# Patient Record
Sex: Male | Born: 2000 | Race: Black or African American | Hispanic: No | Marital: Single | Smoking: Never smoker
Health system: Southern US, Community
[De-identification: ages and names within clinical notes are randomized; demographics above are authoritative.]

## PROBLEM LIST (undated history)

## (undated) DIAGNOSIS — J45909 Unspecified asthma, uncomplicated: Secondary | ICD-10-CM

---
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
## 2020-01-16 ENCOUNTER — Emergency Department (HOSPITAL_BASED_OUTPATIENT_CLINIC_OR_DEPARTMENT_OTHER)
Admission: EM | Admit: 2020-01-16 | Discharge: 2020-01-16 | Disposition: A | Discharge status: Home / Self Care | Payer: BC Managed Care – PPO | Attending: Emergency Medicine | Admitting: Emergency Medicine

## 2020-01-16 ENCOUNTER — Other Ambulatory Visit: Payer: Self-pay

## 2020-01-16 ENCOUNTER — Encounter (HOSPITAL_BASED_OUTPATIENT_CLINIC_OR_DEPARTMENT_OTHER): Payer: Self-pay | Admitting: *Deleted

## 2020-01-16 DIAGNOSIS — J4520 Mild intermittent asthma, uncomplicated: Secondary | ICD-10-CM | POA: Diagnosis present

## 2020-01-16 DIAGNOSIS — Z760 Encounter for issue of repeat prescription: Secondary | ICD-10-CM | POA: Diagnosis not present

## 2020-01-16 HISTORY — DX: Unspecified asthma, uncomplicated: J45.909

## 2020-01-16 MED ORDER — ALBUTEROL SULFATE HFA 108 (90 BASE) MCG/ACT IN AERS: 1.0000 | INHALATION_SPRAY | Freq: Four times a day (QID) | RESPIRATORY_TRACT | 1 refills | Status: AC | PRN

## 2020-01-16 MED ORDER — ALBUTEROL SULFATE (2.5 MG/3ML) 0.083% IN NEBU: 2.5000 mg | INHALATION_SOLUTION | Freq: Four times a day (QID) | RESPIRATORY_TRACT | 12 refills | Status: AC | PRN

## 2020-01-16 MED ORDER — ALBUTEROL SULFATE HFA 108 (90 BASE) MCG/ACT IN AERS: 1.0000 | INHALATION_SPRAY | Freq: Four times a day (QID) | RESPIRATORY_TRACT | 1 refills | Status: DC | PRN

## 2020-01-16 MED ORDER — ALBUTEROL SULFATE HFA 108 (90 BASE) MCG/ACT IN AERS
2.0000 | INHALATION_SPRAY | RESPIRATORY_TRACT | Status: DC | PRN
  Administered 2020-01-16: 2 via RESPIRATORY_TRACT
  Filled 2020-01-16: qty 6.7

## 2020-01-16 NOTE — ED Provider Notes (Signed)
[facility] EMERGENCY DEPARTMENT Provider Note   CSN: [id number] Arrival date & time: 01/16/20  1800     History Chief Complaint  Patient presents with  . Asthma    Greg Villegas is a 19 y.o. male past medical history of asthma, presenting to the emergency department with request for medication refill of his albuterol.  He states he currently does not have a PCP because he recently aged out of his pediatrician.  He manages his asthma with albuterol as needed, however he moved into a new apartment and his wheezing and chest tightness seem to worsen whenever he gets home.  He attributes this to the carpet in his home.  He states he has been taking a daily allergy medication though is unsure of the name.  When he uses his albuterol his symptoms improved.  He is not currently having any symptoms in the ED.  The history is provided by the patient.       Past Medical History:  Diagnosis Date  . Asthma     There are no problems to display for this patient.   History reviewed. No pertinent surgical history.     History reviewed. No pertinent family history.  Social History   Tobacco Use  . Smoking status: Never Smoker  . Smokeless tobacco: Never Used  Substance Use Topics  . Alcohol use: Never  . Drug use: Never    Home Medications Prior to Admission medications   Medication Sig Start Date End Date Taking? Authorizing Provider  albuterol (VENTOLIN HFA) 108 (90 Base) MCG/ACT inhaler Inhale 1-2 puffs into the lungs every 6 (six) hours as needed for wheezing or shortness of breath. 01/16/20   Tanasia Budzinski, [place] N, PA-C  cyclobenzaprine (FLEXERIL) 10 MG tablet Take 1 tablet (10 mg total) by mouth 2 (two) times daily as needed for muscle spasms. 11/01/18   Demani Mcbrien, [place], PA-C    Allergies    Patient has no known allergies.  Review of Systems   Review of Systems  All other systems reviewed and are negative.   Physical Exam Updated Vital Signs BP 139/78    Pulse 72   Temp 99 F (37.2 C) (Oral)   Resp 18   Ht 5\' 8"  (1.727 m)   Wt 68 kg   SpO2 100%   BMI 22.81 kg/m   Physical Exam Vitals and nursing note reviewed.  Constitutional:      Appearance: He is well-developed.  HENT:     Head: Normocephalic and atraumatic.  Eyes:     Conjunctiva/sclera: Conjunctivae normal.  Cardiovascular:     Rate and Rhythm: Normal rate and regular rhythm.  Pulmonary:     Effort: Pulmonary effort is normal. No respiratory distress.     Breath sounds: Wheezing (Mild expiratory wheezes bilaterally) present.  Neurological:     Mental Status: He is alert.  Psychiatric:        Mood and Affect: Mood normal.        Behavior: Behavior normal.     ED Results / Procedures / Treatments   Labs (all labs ordered are listed, but only abnormal results are displayed) Labs Reviewed - No data to display  EKG None  Radiology No results found.  Procedures Procedures (including critical care time)  Medications Ordered in ED Medications  albuterol (VENTOLIN HFA) 108 (90 Base) MCG/ACT inhaler 2 puff (2 puffs Inhalation Given 01/16/20 1821)    ED Course  I have reviewed the triage vital signs and the  nursing notes.  Pertinent labs & imaging results that were available during my care of the patient were reviewed by me and considered in my medical decision making (see chart for details).    MDM Rules/Calculators/A&P                      Patient presenting to the ED for refill of his albuterol.  He is not currently symptomatic with his asthma.  He is encouraged to establish primary care provided patient with instructions as to how to establish primary care using his private insurance.  Recommend continue daily allergy medications.  Will provide Rx for albuterol refill.  Return precautions discussed.  Safe for discharge.   Final Clinical Impression(s) / ED Diagnoses Final diagnoses:  Mild intermittent asthma without complication  Medication refill    Rx  / DC Orders ED Discharge Orders         Ordered    albuterol (VENTOLIN HFA) 108 (90 Base) MCG/ACT inhaler  Every 6 hours PRN,   Status:  Discontinued     01/16/20 1842    albuterol (VENTOLIN HFA) 108 (90 Base) MCG/ACT inhaler  Every 6 hours PRN     01/16/20 1842           Trev Boley, [place] N, PA-C 01/16/20 1843    Davonna Belling, MD 01/17/20 [phone]

## 2020-01-16 NOTE — ED Triage Notes (Signed)
Pt c/o need for refill of albuterol nebs

## 2020-01-16 NOTE — Discharge Instructions (Signed)
Please read instructions below. Use your albuterol inhaler every 4-6 hours as needed for shortness of breath or wheezing.  Establish primary care for regular management of your asthma. Return to the ER if you have shortness of breath not improved by your inhaler, or new or concerning symptoms.

## 2021-01-01 ENCOUNTER — Other Ambulatory Visit: Payer: Self-pay

## 2021-01-01 ENCOUNTER — Emergency Department (HOSPITAL_BASED_OUTPATIENT_CLINIC_OR_DEPARTMENT_OTHER)
Admission: EM | Admit: 2021-01-01 | Discharge: 2021-01-01 | Disposition: A | Discharge status: Home / Self Care | Payer: BC Managed Care – PPO | Attending: Emergency Medicine | Admitting: Emergency Medicine

## 2021-01-01 ENCOUNTER — Encounter (HOSPITAL_BASED_OUTPATIENT_CLINIC_OR_DEPARTMENT_OTHER): Payer: Self-pay | Admitting: Emergency Medicine

## 2021-01-01 DIAGNOSIS — R519 Headache, unspecified: Secondary | ICD-10-CM | POA: Diagnosis not present

## 2021-01-01 DIAGNOSIS — Y92410 Unspecified street and highway as the place of occurrence of the external cause: Secondary | ICD-10-CM | POA: Diagnosis not present

## 2021-01-01 DIAGNOSIS — S161XXA Strain of muscle, fascia and tendon at neck level, initial encounter: Secondary | ICD-10-CM | POA: Diagnosis not present

## 2021-01-01 DIAGNOSIS — J45909 Unspecified asthma, uncomplicated: Secondary | ICD-10-CM | POA: Diagnosis not present

## 2021-01-01 DIAGNOSIS — M25512 Pain in left shoulder: Secondary | ICD-10-CM | POA: Diagnosis not present

## 2021-01-01 DIAGNOSIS — M25511 Pain in right shoulder: Secondary | ICD-10-CM | POA: Diagnosis not present

## 2021-01-01 DIAGNOSIS — S199XXA Unspecified injury of neck, initial encounter: Secondary | ICD-10-CM | POA: Diagnosis present

## 2021-01-01 MED ORDER — NAPROXEN 500 MG PO TABS: 500.0000 mg | ORAL_TABLET | Freq: Two times a day (BID) | ORAL | 0 refills | Status: AC | PRN

## 2021-01-01 MED ORDER — CYCLOBENZAPRINE HCL 10 MG PO TABS: 10.0000 mg | ORAL_TABLET | Freq: Every evening | ORAL | 0 refills | Status: AC | PRN
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
## 2021-01-01 NOTE — Discharge Instructions (Signed)
I am prescribing you a strong muscle relaxer called flexeril. Please only take this medication once in the evening with dinner. This medication can make you quite drowsy. Do not mix it with alcohol. Do not drive a vehicle after taking it.   I am also prescribing you an anti-inflammatory called naproxen.  This is similar to Aleve.  You can take this up to twice a day with food for management of your pain throughout the day.  Please only take this as prescribed.  Please continue to monitor your symptoms closely.  If you develop worsening headaches, nausea/vomiting you cannot control, confusion, weakness, worsening fatigue, please come back to the emergency department immediately for reevaluation.  It was a pleasure to meet you both.
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
## 2022-08-07 ENCOUNTER — Other Ambulatory Visit: Payer: Self-pay | Admitting: Family Medicine

## 2022-08-07 ENCOUNTER — Ambulatory Visit (INDEPENDENT_AMBULATORY_CARE_PROVIDER_SITE_OTHER): Payer: Self-pay

## 2022-08-07 DIAGNOSIS — M25531 Pain in right wrist: Secondary | ICD-10-CM

## 2022-08-07 DIAGNOSIS — M79641 Pain in right hand: Secondary | ICD-10-CM

## 2023-05-09 ENCOUNTER — Ambulatory Visit (INDEPENDENT_AMBULATORY_CARE_PROVIDER_SITE_OTHER): Payer: Worker's Compensation

## 2023-05-09 ENCOUNTER — Other Ambulatory Visit: Payer: Self-pay | Admitting: Family Medicine

## 2023-05-09 DIAGNOSIS — T1490XA Injury, unspecified, initial encounter: Secondary | ICD-10-CM

## 2023-05-09 DIAGNOSIS — S4991XA Unspecified injury of right shoulder and upper arm, initial encounter: Secondary | ICD-10-CM
